# Patient Record
Sex: Female | Born: 1950 | Race: White | Hispanic: No | Marital: Married | Smoking: Never smoker
Health system: Southern US, Community
[De-identification: ages and names within clinical notes are randomized; demographics above are authoritative.]

## PROBLEM LIST (undated history)

## (undated) DIAGNOSIS — I10 Essential (primary) hypertension: Secondary | ICD-10-CM

## (undated) DIAGNOSIS — K219 Gastro-esophageal reflux disease without esophagitis: Secondary | ICD-10-CM

## (undated) DIAGNOSIS — F329 Major depressive disorder, single episode, unspecified: Secondary | ICD-10-CM

## (undated) DIAGNOSIS — D649 Anemia, unspecified: Secondary | ICD-10-CM

## (undated) DIAGNOSIS — E785 Hyperlipidemia, unspecified: Secondary | ICD-10-CM

## (undated) DIAGNOSIS — F32A Depression, unspecified: Secondary | ICD-10-CM

## (undated) DIAGNOSIS — H269 Unspecified cataract: Secondary | ICD-10-CM

## (undated) DIAGNOSIS — F419 Anxiety disorder, unspecified: Secondary | ICD-10-CM

## (undated) HISTORY — DX: Anemia, unspecified: D64.9

## (undated) HISTORY — PX: REDUCTION MAMMAPLASTY: SUR839

## (undated) HISTORY — DX: Essential (primary) hypertension: I10

## (undated) HISTORY — DX: Hyperlipidemia, unspecified: E78.5

## (undated) HISTORY — DX: Gastro-esophageal reflux disease without esophagitis: K21.9

## (undated) HISTORY — DX: Anxiety disorder, unspecified: F41.9

## (undated) HISTORY — DX: Major depressive disorder, single episode, unspecified: F32.9

## (undated) HISTORY — DX: Unspecified cataract: H26.9

## (undated) HISTORY — PX: TUBAL LIGATION: SHX77

## (undated) HISTORY — DX: Depression, unspecified: F32.A

## (undated) HISTORY — PX: BREAST SURGERY: SHX581

---

## 2013-03-13 LAB — HM COLONOSCOPY

## 2016-03-13 ENCOUNTER — Ambulatory Visit (INDEPENDENT_AMBULATORY_CARE_PROVIDER_SITE_OTHER): Payer: BLUE CROSS/BLUE SHIELD | Admitting: Medical

## 2016-03-13 ENCOUNTER — Encounter: Payer: Self-pay | Admitting: Medical

## 2016-03-13 VITALS — BP 138/84 | HR 96 | Temp 98.2°F | Ht 63.5 in | Wt 177.6 lb

## 2016-03-13 DIAGNOSIS — R030 Elevated blood-pressure reading, without diagnosis of hypertension: Secondary | ICD-10-CM | POA: Diagnosis not present

## 2016-03-13 DIAGNOSIS — E785 Hyperlipidemia, unspecified: Secondary | ICD-10-CM

## 2016-03-13 DIAGNOSIS — Z23 Encounter for immunization: Secondary | ICD-10-CM | POA: Diagnosis not present

## 2016-03-13 DIAGNOSIS — Z0000 Encounter for general adult medical examination without abnormal findings: Secondary | ICD-10-CM

## 2016-03-13 DIAGNOSIS — Z1239 Encounter for other screening for malignant neoplasm of breast: Secondary | ICD-10-CM | POA: Diagnosis not present

## 2016-03-13 DIAGNOSIS — F4323 Adjustment disorder with mixed anxiety and depressed mood: Secondary | ICD-10-CM

## 2016-03-13 LAB — CBC WITH DIFFERENTIAL/PLATELET
BASOS ABS: 0 10*3/uL (ref 0.0–0.1)
BASOS PCT: 0.4 % (ref 0.0–3.0)
Eosinophils Absolute: 0.1 10*3/uL (ref 0.0–0.7)
Eosinophils Relative: 1.8 % (ref 0.0–5.0)
HCT: 42.5 % (ref 36.0–46.0)
Hemoglobin: 14.4 g/dL (ref 12.0–15.0)
LYMPHS ABS: 1.6 10*3/uL (ref 0.7–4.0)
Lymphocytes Relative: 25.4 % (ref 12.0–46.0)
MCHC: 33.8 g/dL (ref 30.0–36.0)
MCV: 85.7 fl (ref 78.0–100.0)
MONOS PCT: 5.2 % (ref 3.0–12.0)
Monocytes Absolute: 0.3 10*3/uL (ref 0.1–1.0)
NEUTROS ABS: 4.2 10*3/uL (ref 1.4–7.7)
Neutrophils Relative %: 67.2 % (ref 43.0–77.0)
PLATELETS: 264 10*3/uL (ref 150.0–400.0)
RBC: 4.96 Mil/uL (ref 3.87–5.11)
RDW: 14 % (ref 11.5–15.5)
WBC: 6.3 10*3/uL (ref 4.0–10.5)

## 2016-03-13 LAB — COMPREHENSIVE METABOLIC PANEL
ALBUMIN: 4.4 g/dL (ref 3.5–5.2)
ALK PHOS: 53 U/L (ref 39–117)
ALT: 21 U/L (ref 0–35)
AST: 17 U/L (ref 0–37)
BILIRUBIN TOTAL: 0.4 mg/dL (ref 0.2–1.2)
BUN: 17 mg/dL (ref 6–23)
CALCIUM: 9.1 mg/dL (ref 8.4–10.5)
CO2: 30 meq/L (ref 19–32)
CREATININE: 0.74 mg/dL (ref 0.40–1.20)
Chloride: 103 mEq/L (ref 96–112)
GFR: 84.07 mL/min (ref >60.00)
Glucose, Bld: 120 mg/dL — ABNORMAL HIGH (ref 70–99)
Potassium: 3.7 mEq/L (ref 3.5–5.1)
Sodium: 141 mEq/L (ref 135–145)
TOTAL PROTEIN: 7.2 g/dL (ref 6.0–8.3)

## 2016-03-13 LAB — LIPID PANEL
CHOLESTEROL: 306 mg/dL — AB (ref 0–200)
HDL: 47.5 mg/dL (ref >39.00)
LDL CALC: 220 mg/dL — AB (ref 0–99)
NonHDL: 258.18
TRIGLYCERIDES: 191 mg/dL — AB (ref 0.0–149.0)
Total CHOL/HDL Ratio: 6
VLDL: 38.2 mg/dL (ref 0.0–40.0)

## 2016-03-13 LAB — POCT URINALYSIS DIPSTICK
Bilirubin, UA: NEGATIVE
Blood, UA: NEGATIVE
Glucose, UA: NEGATIVE
KETONES UA: NEGATIVE
LEUKOCYTES UA: NEGATIVE
Nitrite, UA: NEGATIVE
PH UA: 6
PROTEIN UA: NEGATIVE
Spec Grav, UA: 1.025
UROBILINOGEN UA: 4

## 2016-03-13 LAB — TSH: TSH: 2.27 u[IU]/mL (ref 0.35–4.50)

## 2016-03-13 LAB — HIV ANTIBODY (ROUTINE TESTING W REFLEX): HIV: NONREACTIVE

## 2016-03-13 MED ORDER — TRAZODONE HCL 50 MG PO TABS: 25.0000 mg | ORAL_TABLET | Freq: Every evening | ORAL | 3 refills | Status: AC | PRN

## 2016-03-13 MED FILL — traZODone HCL 50 MG TABS: 50 | 30 days supply | Qty: 30 | Fill #0

## 2016-03-13 NOTE — Progress Notes (Signed)
Pre visit review using our clinic tool,if applicable. No additional management support is needed unless otherwise documented below in the visit note.  

## 2016-03-13 NOTE — Progress Notes (Signed)
Subjective:    Patient ID: Jordan SinningJane Goodall, female    DOB: 1951-01-01, 65 y.o.   MRN: [id number]  HPI   Pt in for first time.  I have reviewed pt PMH, PSH, FH, Social History and Surgical History.  Exercises 3-4 days a week, dieting(eating meats and vegetables), married.  Pt is new to area for 2 years.  Pt has some depression and anxiety. Some at times related to extreme life stressors. Pt does have some insomnia. For years in the past. Pt has used trazadone in past for insomnia. Did help. Pt mood is controlled presently. Has been for some time. Last time felt depressed mood and anxious was after dad death.  Pt had colonosocpy about 3 years ago and told to repeat in 10 years. Pt last mammogram has been more than 4 yrs since last one.  Pt states pap smear last done 2 years ago. She states was normal.   Pt had some reflux in the past but no any recently.   Pt bp is controlled today. Pt bp was high in the past. But she checks it in past often. It is always lower than 140/90. When first worked up occurred being worked up for accident and was in pain. Also weighed 15 lbs heavier.(lost weight over past year purposeful weight loss)  Pt has history of low level anemia. She states no cause found. Occasionally used iron in the past. No hx of transfusions.  Pt has history of hypercholesteralemia. Pt refuses to be on any meds. Her dad had extreme reaction to statins per her report.  Pt has not had flu vaccine. She is willing to get.     Review of Systems  Constitutional: Negative for chills, fatigue and fever.  HENT: Negative for dental problem and ear discharge.   Respiratory: Negative for cough, chest tightness, shortness of breath and wheezing.   Cardiovascular: Negative for chest pain and palpitations.  Gastrointestinal: Negative for abdominal pain and anal bleeding.  Musculoskeletal: Negative for back pain.  Skin: Negative for rash.  Neurological: Negative for dizziness, speech  difficulty, weakness, numbness and headaches.  Hematological: Negative for adenopathy. Does not bruise/bleed easily.  Psychiatric/Behavioral: Negative for behavioral problems and confusion.       Pt states history of ADD.     Past Medical History:  Diagnosis Date  . Anemia   . Anxiety   . Cataract   . Depression   . GERD (gastroesophageal reflux disease)   . Hyperlipidemia   . Hypertension      Social History   Social History  . Marital status: Married    Spouse name: N/A  . Number of children: N/A  . Years of education: N/A   Occupational History  . Not on file.   Social History Main Topics  . Smoking status: Never Smoker  . Smokeless tobacco: Never Used  . Alcohol use Yes  . Drug use: Unknown  . Sexual activity: Not on file   Other Topics Concern  . Not on file   Social History Narrative  . No narrative on file    Past Surgical History:  Procedure Laterality Date  . BREAST SURGERY     Reduction  . TUBAL LIGATION      Family History  Problem Relation Age of Onset  . Hypertension Mother   . Dementia Mother   . Hypertension Father   . Hyperlipidemia Father     Allergies  Allergen Reactions  . Codeine     No  current outpatient prescriptions on file prior to visit.   No current facility-administered medications on file prior to visit.     BP 138/84   Pulse 96   Temp 98.2 F (36.8 C) (Oral)   Ht 5' 3.5" (1.613 m)   Wt 177 lb 9.6 oz (80.6 kg)   SpO2 98%   BMI 30.97 kg/m       Objective:   Physical Exam  General Mental Status- Alert. General Appearance- Not in acute distress.   Skin General: Color- Normal Color. Moisture- Normal Moisture.  Neck Carotid Arteries- Normal color. Moisture- Normal Moisture. No carotid bruits. No JVD.  Chest and Lung Exam Auscultation: Breath Sounds:-Normal.  Cardiovascular Auscultation:Rythm- Regular. Murmurs & Other Heart Sounds:Auscultation of the heart reveals- No  Murmurs.  Abdomen Inspection:-Inspeection Normal. Palpation/Percussion:Note:No mass. Palpation and Percussion of the abdomen reveal- Non Tender, Non Distended + BS, no rebound or guarding.   Neurologic Cranial Nerve exam:- CN III-XII intact(No nystagmus), symmetric smile. Strength:- 5/5 equal and symmetric strength both upper and lower extremities.  Skin- pt has 2 seborrheic keratosis on her back. About 6 mm in diameter. Other wise no worrisome lesions.(pt declines referral to derm)      Assessment & Plan:  Wellness examination Cbc, cmp tsh, lipid panel, and get ua. Pt will defer her flu vaccine until September or early October. Pt willing to get tdap.  Up to date on colonosocopy.  Recommend gettting papsmear next year. We can do here on wellness or refer to gyn  Order placed for mammogram today.(Pt states done with [facility] connected to [place]).       Asked MA Clydie Braun to abstract her colonoscopy done 3 yrs ago and papsmear done 2 years ago.  For insomnia rx trazadone.  Follow up date to be determined after lab review.  Rx trazadone for insomnia.  Eesha Schmaltz, Ramon Dredge, PA-C

## 2016-03-13 NOTE — Patient Instructions (Addendum)
Wellness examination Cbc, cmp tsh, lipid panel, and get ua. Pt will defer her flu vaccine until September or early October. Pt willing to get tdap.  Up to date on colonosocopy.  Recommend gettting papsmear next year. We can do here on wellness or refer to gyn  Order placed for mammogram today.(Pt states done with [facility] connected to [place]).     Rx trazadone. For insomnia  Follow up 1 date to be determined after lab review.  Preventive Care for Adults, Female A healthy lifestyle and preventive care can promote health and wellness. Preventive health guidelines for women include the following key practices.  A routine yearly physical is a good way to check with your health care provider about your health and preventive screening. It is a chance to share any concerns and updates on your health and to receive a thorough exam.  Visit your dentist for a routine exam and preventive care every 6 months. Brush your teeth twice a day and floss once a day. Good oral hygiene prevents tooth decay and gum disease.  The frequency of eye exams is based on your age, health, family medical history, use of contact lenses, and other factors. Follow your health care provider's recommendations for frequency of eye exams.  Eat a healthy diet. Foods like vegetables, fruits, whole grains, low-fat dairy products, and lean protein foods contain the nutrients you need without too many calories. Decrease your intake of foods high in solid fats, added sugars, and salt. Eat the right amount of calories for you.Get information about a proper diet from your health care provider, if necessary.  Regular physical exercise is one of the most important things you can do for your health. Most adults should get at least 150 minutes of moderate-intensity exercise (any activity that increases your heart rate and causes you to sweat) each week. In addition, most adults need muscle-strengthening exercises on 2 or more days a  week.  Maintain a healthy weight. The body mass index (BMI) is a screening tool to identify possible weight problems. It provides an estimate of body fat based on height and weight. Your health care provider can find your BMI and can help you achieve or maintain a healthy weight.For adults 20 years and older:  A BMI below 18.5 is considered underweight.  A BMI of 18.5 to 24.9 is normal.  A BMI of 25 to 29.9 is considered overweight.  A BMI of 30 and above is considered obese.  Maintain normal blood lipids and cholesterol levels by exercising and minimizing your intake of saturated fat. Eat a balanced diet with plenty of fruit and vegetables. Blood tests for lipids and cholesterol should begin at age 7 and be repeated every 5 years. If your lipid or cholesterol levels are high, you are over 50, or you are at high risk for heart disease, you may need your cholesterol levels checked more frequently.Ongoing high lipid and cholesterol levels should be treated with medicines if diet and exercise are not working.  If you smoke, find out from your health care provider how to quit. If you do not use tobacco, do not start.  Lung cancer screening is recommended for adults aged 71-80 years who are at high risk for developing lung cancer because of a history of smoking. A yearly low-dose CT scan of the lungs is recommended for people who have at least a 30-pack-year history of smoking and are a current smoker or have quit within the past 15 years. A pack year  of smoking is smoking an average of 1 pack of cigarettes a day for 1 year (for example: 1 pack a day for 30 years or 2 packs a day for 15 years). Yearly screening should continue until the smoker has stopped smoking for at least 15 years. Yearly screening should be stopped for people who develop a health problem that would prevent them from having lung cancer treatment.  If you are pregnant, do not drink alcohol. If you are breastfeeding, be very  cautious about drinking alcohol. If you are not pregnant and choose to drink alcohol, do not have more than 1 drink per day. One drink is considered to be 12 ounces (355 mL) of beer, 5 ounces (148 mL) of wine, or 1.5 ounces (44 mL) of liquor.  Avoid use of street drugs. Do not share needles with anyone. Ask for help if you need support or instructions about stopping the use of drugs.  High blood pressure causes heart disease and increases the risk of stroke. Your blood pressure should be checked at least every 1 to 2 years. Ongoing high blood pressure should be treated with medicines if weight loss and exercise do not work.  If you are 34-47 years old, ask your health care provider if you should take aspirin to prevent strokes.  Diabetes screening is done by taking a blood sample to check your blood glucose level after you have not eaten for a certain period of time (fasting). If you are not overweight and you do not have risk factors for diabetes, you should be screened once every 3 years starting at age 28. If you are overweight or obese and you are 74-53 years of age, you should be screened for diabetes every year as part of your cardiovascular risk assessment.  Breast cancer screening is essential preventive care for women. You should practice "breast self-awareness." This means understanding the normal appearance and feel of your breasts and may include breast self-examination. Any changes detected, no matter how small, should be reported to a health care provider. Women in their 77s and 30s should have a clinical breast exam (CBE) by a health care provider as part of a regular health exam every 1 to 3 years. After age 65, women should have a CBE every year. Starting at age 43, women should consider having a mammogram (breast X-ray test) every year. Women who have a family history of breast cancer should talk to their health care provider about genetic screening. Women at a high risk of breast cancer  should talk to their health care providers about having an MRI and a mammogram every year.  Breast cancer gene (BRCA)-related cancer risk assessment is recommended for women who have family members with BRCA-related cancers. BRCA-related cancers include breast, ovarian, tubal, and peritoneal cancers. Having family members with these cancers may be associated with an increased risk for harmful changes (mutations) in the breast cancer genes BRCA1 and BRCA2. Results of the assessment will determine the need for genetic counseling and BRCA1 and BRCA2 testing.  Your health care provider may recommend that you be screened regularly for cancer of the pelvic organs (ovaries, uterus, and vagina). This screening involves a pelvic examination, including checking for microscopic changes to the surface of your cervix (Pap test). You may be encouraged to have this screening done every 3 years, beginning at age 71.  For women ages 40-65, health care providers may recommend pelvic exams and Pap testing every 3 years, or they may recommend the Pap  and pelvic exam, combined with testing for human papilloma virus (HPV), every 5 years. Some types of HPV increase your risk of cervical cancer. Testing for HPV may also be done on women of any age with unclear Pap test results.  Other health care providers may not recommend any screening for nonpregnant women who are considered low risk for pelvic cancer and who do not have symptoms. Ask your health care provider if a screening pelvic exam is right for you.  If you have had past treatment for cervical cancer or a condition that could lead to cancer, you need Pap tests and screening for cancer for at least 20 years after your treatment. If Pap tests have been discontinued, your risk factors (such as having a new sexual partner) need to be reassessed to determine if screening should resume. Some women have medical problems that increase the chance of getting cervical cancer. In  these cases, your health care provider may recommend more frequent screening and Pap tests.  Colorectal cancer can be detected and often prevented. Most routine colorectal cancer screening begins at the age of 54 years and continues through age 59 years. However, your health care provider may recommend screening at an earlier age if you have risk factors for colon cancer. On a yearly basis, your health care provider may provide home test kits to check for hidden blood in the stool. Use of a small camera at the end of a tube, to directly examine the colon (sigmoidoscopy or colonoscopy), can detect the earliest forms of colorectal cancer. Talk to your health care provider about this at age 32, when routine screening begins. Direct exam of the colon should be repeated every 5-10 years through age 56 years, unless early forms of precancerous polyps or small growths are found.  People who are at an increased risk for hepatitis B should be screened for this virus. You are considered at high risk for hepatitis B if:  You were born in a country where hepatitis B occurs often. Talk with your health care provider about which countries are considered high risk.  Your parents were born in a high-risk country and you have not received a shot to protect against hepatitis B (hepatitis B vaccine).  You have HIV or AIDS.  You use needles to inject street drugs.  You live with, or have sex with, someone who has hepatitis B.  You get hemodialysis treatment.  You take certain medicines for conditions like cancer, organ transplantation, and autoimmune conditions.  Hepatitis C blood testing is recommended for all people born from [birth year] through 1965 and any individual with known risks for hepatitis C.  Practice safe sex. Use condoms and avoid high-risk sexual practices to reduce the spread of sexually transmitted infections (STIs). STIs include gonorrhea, chlamydia, syphilis, trichomonas, herpes, HPV, and human  immunodeficiency virus (HIV). Herpes, HIV, and HPV are viral illnesses that have no cure. They can result in disability, cancer, and death.  You should be screened for sexually transmitted illnesses (STIs) including gonorrhea and chlamydia if:  You are sexually active and are younger than 24 years.  You are older than 24 years and your health care provider tells you that you are at risk for this type of infection.  Your sexual activity has changed since you were last screened and you are at an increased risk for chlamydia or gonorrhea. Ask your health care provider if you are at risk.  If you are at risk of being infected with HIV, it  is recommended that you take a prescription medicine daily to prevent HIV infection. This is called preexposure prophylaxis (PrEP). You are considered at risk if:  You are sexually active and do not regularly use condoms or know the HIV status of your partner(s).  You take drugs by injection.  You are sexually active with a partner who has HIV.  Talk with your health care provider about whether you are at high risk of being infected with HIV. If you choose to begin PrEP, you should first be tested for HIV. You should then be tested every 3 months for as long as you are taking PrEP.  Osteoporosis is a disease in which the bones lose minerals and strength with aging. This can result in serious bone fractures or breaks. The risk of osteoporosis can be identified using a bone density scan. Women ages 31 years and over and women at risk for fractures or osteoporosis should discuss screening with their health care providers. Ask your health care provider whether you should take a calcium supplement or vitamin D to reduce the rate of osteoporosis.  Menopause can be associated with physical symptoms and risks. Hormone replacement therapy is available to decrease symptoms and risks. You should talk to your health care provider about whether hormone replacement therapy is  right for you.  Use sunscreen. Apply sunscreen liberally and repeatedly throughout the day. You should seek shade when your shadow is shorter than you. Protect yourself by wearing long sleeves, pants, a wide-brimmed hat, and sunglasses year round, whenever you are outdoors.  Once a month, do a whole body skin exam, using a mirror to look at the skin on your back. Tell your health care provider of new moles, moles that have irregular borders, moles that are larger than a pencil eraser, or moles that have changed in shape or color.  Stay current with required vaccines (immunizations).  Influenza vaccine. All adults should be immunized every year.  Tetanus, diphtheria, and acellular pertussis (Td, Tdap) vaccine. Pregnant women should receive 1 dose of Tdap vaccine during each pregnancy. The dose should be obtained regardless of the length of time since the last dose. Immunization is preferred during the 27th-36th week of gestation. An adult who has not previously received Tdap or who does not know her vaccine status should receive 1 dose of Tdap. This initial dose should be followed by tetanus and diphtheria toxoids (Td) booster doses every 10 years. Adults with an unknown or incomplete history of completing a 3-dose immunization series with Td-containing vaccines should begin or complete a primary immunization series including a Tdap dose. Adults should receive a Td booster every 10 years.  Varicella vaccine. An adult without evidence of immunity to varicella should receive 2 doses or a second dose if she has previously received 1 dose. Pregnant females who do not have evidence of immunity should receive the first dose after pregnancy. This first dose should be obtained before leaving the health care facility. The second dose should be obtained 4-8 weeks after the first dose.  Human papillomavirus (HPV) vaccine. Females aged 13-26 years who have not received the vaccine previously should obtain the  3-dose series. The vaccine is not recommended for use in pregnant females. However, pregnancy testing is not needed before receiving a dose. If a female is found to be pregnant after receiving a dose, no treatment is needed. In that case, the remaining doses should be delayed until after the pregnancy. Immunization is recommended for any person with an  immunocompromised condition through the age of 80 years if she did not get any or all doses earlier. During the 3-dose series, the second dose should be obtained 4-8 weeks after the first dose. The third dose should be obtained 24 weeks after the first dose and 16 weeks after the second dose.  Zoster vaccine. One dose is recommended for adults aged 40 years or older unless certain conditions are present.  Measles, mumps, and rubella (MMR) vaccine. Adults born before [birth year] generally are considered immune to measles and mumps. Adults born in [birth year] or later should have 1 or more doses of MMR vaccine unless there is a contraindication to the vaccine or there is laboratory evidence of immunity to each of the three diseases. A routine second dose of MMR vaccine should be obtained at least 28 days after the first dose for students attending postsecondary schools, health care workers, or international travelers. People who received inactivated measles vaccine or an unknown type of measles vaccine during 1963-1967 should receive 2 doses of MMR vaccine. People who received inactivated mumps vaccine or an unknown type of mumps vaccine before 1979 and are at high risk for mumps infection should consider immunization with 2 doses of MMR vaccine. For females of childbearing age, rubella immunity should be determined. If there is no evidence of immunity, females who are not pregnant should be vaccinated. If there is no evidence of immunity, females who are pregnant should delay immunization until after pregnancy. Unvaccinated health care workers born before [birth year] who lack  laboratory evidence of measles, mumps, or rubella immunity or laboratory confirmation of disease should consider measles and mumps immunization with 2 doses of MMR vaccine or rubella immunization with 1 dose of MMR vaccine.  Pneumococcal 13-valent conjugate (PCV13) vaccine. When indicated, a person who is uncertain of his immunization history and has no record of immunization should receive the PCV13 vaccine. All adults 59 years of age and older should receive this vaccine. An adult aged 102 years or older who has certain medical conditions and has not been previously immunized should receive 1 dose of PCV13 vaccine. This PCV13 should be followed with a dose of pneumococcal polysaccharide (PPSV23) vaccine. Adults who are at high risk for pneumococcal disease should obtain the PPSV23 vaccine at least 8 weeks after the dose of PCV13 vaccine. Adults older than 64 years of age who have normal immune system function should obtain the PPSV23 vaccine dose at least 1 year after the dose of PCV13 vaccine.  Pneumococcal polysaccharide (PPSV23) vaccine. When PCV13 is also indicated, PCV13 should be obtained first. All adults aged 53 years and older should be immunized. An adult younger than age 73 years who has certain medical conditions should be immunized. Any person who resides in a nursing home or long-term care facility should be immunized. An adult smoker should be immunized. People with an immunocompromised condition and certain other conditions should receive both PCV13 and PPSV23 vaccines. People with human immunodeficiency virus (HIV) infection should be immunized as soon as possible after diagnosis. Immunization during chemotherapy or radiation therapy should be avoided. Routine use of PPSV23 vaccine is not recommended for American Indians, [place] Natives, or people younger than 65 years unless there are medical conditions that require PPSV23 vaccine. When indicated, people who have unknown immunization and have  no record of immunization should receive PPSV23 vaccine. One-time revaccination 5 years after the first dose of PPSV23 is recommended for people aged 19-64 years who have chronic kidney failure, nephrotic syndrome, asplenia,  or immunocompromised conditions. People who received 1-2 doses of PPSV23 before age 68 years should receive another dose of PPSV23 vaccine at age 67 years or later if at least 5 years have passed since the previous dose. Doses of PPSV23 are not needed for people immunized with PPSV23 at or after age 24 years.  Meningococcal vaccine. Adults with asplenia or persistent complement component deficiencies should receive 2 doses of quadrivalent meningococcal conjugate (MenACWY-D) vaccine. The doses should be obtained at least 2 months apart. Microbiologists working with certain meningococcal bacteria, [place] recruits, people at risk during an outbreak, and people who travel to or live in countries with a high rate of meningitis should be immunized. A first-year college student up through age 55 years who is living in a residence hall should receive a dose if she did not receive a dose on or after her 16th birthday. Adults who have certain high-risk conditions should receive one or more doses of vaccine.  Hepatitis A vaccine. Adults who wish to be protected from this disease, have certain high-risk conditions, work with hepatitis A-infected animals, work in hepatitis A research labs, or travel to or work in countries with a high rate of hepatitis A should be immunized. Adults who were previously unvaccinated and who anticipate close contact with an international adoptee during the first 60 days after arrival in the [place] from a country with a high rate of hepatitis A should be immunized.  Hepatitis B vaccine. Adults who wish to be protected from this disease, have certain high-risk conditions, may be exposed to blood or other infectious body fluids, are household contacts or sex  partners of hepatitis B positive people, are clients or workers in certain care facilities, or travel to or work in countries with a high rate of hepatitis B should be immunized.  Haemophilus influenzae type b (Hib) vaccine. A previously unvaccinated person with asplenia or sickle cell disease or having a scheduled splenectomy should receive 1 dose of Hib vaccine. Regardless of previous immunization, a recipient of a hematopoietic stem cell transplant should receive a 3-dose series 6-12 months after her successful transplant. Hib vaccine is not recommended for adults with HIV infection. Preventive Services / Frequency Ages 67 to 40 years  Blood pressure check.** / Every 3-5 years.  Lipid and cholesterol check.** / Every 5 years beginning at age 46.  Clinical breast exam.** / Every 3 years for women in their 56s and 79s.  BRCA-related cancer risk assessment.** / For women who have family members with a BRCA-related cancer (breast, ovarian, tubal, or peritoneal cancers).  Pap test.** / Every 2 years from ages 40 through 110. Every 3 years starting at age 67 through age 48 or 86 with a history of 3 consecutive normal Pap tests.  HPV screening.** / Every 3 years from ages 70 through ages 52 to 68 with a history of 3 consecutive normal Pap tests.  Hepatitis C blood test.** / For any individual with known risks for hepatitis C.  Skin self-exam. / Monthly.  Influenza vaccine. / Every year.  Tetanus, diphtheria, and acellular pertussis (Tdap, Td) vaccine.** / Consult your health care provider. Pregnant women should receive 1 dose of Tdap vaccine during each pregnancy. 1 dose of Td every 10 years.  Varicella vaccine.** / Consult your health care provider. Pregnant females who do not have evidence of immunity should receive the first dose after pregnancy.  HPV vaccine. / 3 doses over 6 months, if 65 and younger. The vaccine is not  recommended for use in pregnant females. However, pregnancy testing  is not needed before receiving a dose.  Measles, mumps, rubella (MMR) vaccine.** / You need at least 1 dose of MMR if you were born in 1957 or later. You may also need a 2nd dose. For females of childbearing age, rubella immunity should be determined. If there is no evidence of immunity, females who are not pregnant should be vaccinated. If there is no evidence of immunity, females who are pregnant should delay immunization until after pregnancy.  Pneumococcal 13-valent conjugate (PCV13) vaccine.** / Consult your health care provider.  Pneumococcal polysaccharide (PPSV23) vaccine.** / 1 to 2 doses if you smoke cigarettes or if you have certain conditions.  Meningococcal vaccine.** / 1 dose if you are age 65 to 41 years and a Market researcher living in a residence hall, or have one of several medical conditions, you need to get vaccinated against meningococcal disease. You may also need additional booster doses.  Hepatitis A vaccine.** / Consult your health care provider.  Hepatitis B vaccine.** / Consult your health care provider.  Haemophilus influenzae type b (Hib) vaccine.** / Consult your health care provider. Ages 41 to 4 years  Blood pressure check.** / Every year.  Lipid and cholesterol check.** / Every 5 years beginning at age 60 years.  Lung cancer screening. / Every year if you are aged 43-80 years and have a 30-pack-year history of smoking and currently smoke or have quit within the past 15 years. Yearly screening is stopped once you have quit smoking for at least 15 years or develop a health problem that would prevent you from having lung cancer treatment.  Clinical breast exam.** / Every year after age 77 years.  BRCA-related cancer risk assessment.** / For women who have family members with a BRCA-related cancer (breast, ovarian, tubal, or peritoneal cancers).  Mammogram.** / Every year beginning at age 11 years and continuing for as long as you are in good  health. Consult with your health care provider.  Pap test.** / Every 3 years starting at age 74 years through age 45 or 70 years with a history of 3 consecutive normal Pap tests.  HPV screening.** / Every 3 years from ages 24 years through ages 78 to 66 years with a history of 3 consecutive normal Pap tests.  Fecal occult blood test (FOBT) of stool. / Every year beginning at age 39 years and continuing until age 97 years. You may not need to do this test if you get a colonoscopy every 10 years.  Flexible sigmoidoscopy or colonoscopy.** / Every 5 years for a flexible sigmoidoscopy or every 10 years for a colonoscopy beginning at age 72 years and continuing until age 48 years.  Hepatitis C blood test.** / For all people born from [birth year] through 1964 and any individual with known risks for hepatitis C.  Skin self-exam. / Monthly.  Influenza vaccine. / Every year.  Tetanus, diphtheria, and acellular pertussis (Tdap/Td) vaccine.** / Consult your health care provider. Pregnant women should receive 1 dose of Tdap vaccine during each pregnancy. 1 dose of Td every 10 years.  Varicella vaccine.** / Consult your health care provider. Pregnant females who do not have evidence of immunity should receive the first dose after pregnancy.  Zoster vaccine.** / 1 dose for adults aged 77 years or older.  Measles, mumps, rubella (MMR) vaccine.** / You need at least 1 dose of MMR if you were born in 1957 or later. You may also need  a second dose. For females of childbearing age, rubella immunity should be determined. If there is no evidence of immunity, females who are not pregnant should be vaccinated. If there is no evidence of immunity, females who are pregnant should delay immunization until after pregnancy.  Pneumococcal 13-valent conjugate (PCV13) vaccine.** / Consult your health care provider.  Pneumococcal polysaccharide (PPSV23) vaccine.** / 1 to 2 doses if you smoke cigarettes or if you have certain  conditions.  Meningococcal vaccine.** / Consult your health care provider.  Hepatitis A vaccine.** / Consult your health care provider.  Hepatitis B vaccine.** / Consult your health care provider.  Haemophilus influenzae type b (Hib) vaccine.** / Consult your health care provider. Ages 71 years and over  Blood pressure check.** / Every year.  Lipid and cholesterol check.** / Every 5 years beginning at age 93 years.  Lung cancer screening. / Every year if you are aged 31-80 years and have a 30-pack-year history of smoking and currently smoke or have quit within the past 15 years. Yearly screening is stopped once you have quit smoking for at least 15 years or develop a health problem that would prevent you from having lung cancer treatment.  Clinical breast exam.** / Every year after age 81 years.  BRCA-related cancer risk assessment.** / For women who have family members with a BRCA-related cancer (breast, ovarian, tubal, or peritoneal cancers).  Mammogram.** / Every year beginning at age 73 years and continuing for as long as you are in good health. Consult with your health care provider.  Pap test.** / Every 3 years starting at age 48 years through age 61 or 92 years with 3 consecutive normal Pap tests. Testing can be stopped between 65 and 70 years with 3 consecutive normal Pap tests and no abnormal Pap or HPV tests in the past 10 years.  HPV screening.** / Every 3 years from ages 49 years through ages 40 or 70 years with a history of 3 consecutive normal Pap tests. Testing can be stopped between 65 and 70 years with 3 consecutive normal Pap tests and no abnormal Pap or HPV tests in the past 10 years.  Fecal occult blood test (FOBT) of stool. / Every year beginning at age 32 years and continuing until age 67 years. You may not need to do this test if you get a colonoscopy every 10 years.  Flexible sigmoidoscopy or colonoscopy.** / Every 5 years for a flexible sigmoidoscopy or every 10  years for a colonoscopy beginning at age 110 years and continuing until age 91 years.  Hepatitis C blood test.** / For all people born from [birth year] through 1964 and any individual with known risks for hepatitis C.  Osteoporosis screening.** / A one-time screening for women ages 64 years and over and women at risk for fractures or osteoporosis.  Skin self-exam. / Monthly.  Influenza vaccine. / Every year.  Tetanus, diphtheria, and acellular pertussis (Tdap/Td) vaccine.** / 1 dose of Td every 10 years.  Varicella vaccine.** / Consult your health care provider.  Zoster vaccine.** / 1 dose for adults aged 64 years or older.  Pneumococcal 13-valent conjugate (PCV13) vaccine.** / Consult your health care provider.  Pneumococcal polysaccharide (PPSV23) vaccine.** / 1 dose for all adults aged 72 years and older.  Meningococcal vaccine.** / Consult your health care provider.  Hepatitis A vaccine.** / Consult your health care provider.  Hepatitis B vaccine.** / Consult your health care provider.  Haemophilus influenzae type b (Hib) vaccine.** / Consult your  health care provider. ** Family history and personal history of risk and conditions may change your health care provider's recommendations.   This information is not intended to replace advice given to you by your health care provider. Make sure you discuss any questions you have with your health care provider.   Document Released: 08/29/2001 Document Revised: 07/24/2014 Document Reviewed: 11/28/2010 Elsevier Interactive Patient Education [employer].

## 2016-03-13 NOTE — Assessment & Plan Note (Signed)
Cbc, cmp tsh, lipid panel, and get ua. Pt will defer her flu vaccine until September or early October. Pt willing to get tdap.  Up to date on colonosocopy.  Recommend gettting papsmear next year. We can do here on wellness or refer to gyn  Order placed for mammogram today.(Pt states done with [facility] to [facility]).

## 2016-03-21 ENCOUNTER — Ambulatory Visit (HOSPITAL_BASED_OUTPATIENT_CLINIC_OR_DEPARTMENT_OTHER)
Admission: RE | Admit: 2016-03-21 | Discharge: 2016-03-21 | Disposition: A | Discharge status: Home / Self Care | Payer: BLUE CROSS/BLUE SHIELD | Source: Ambulatory Visit | Attending: Medical | Admitting: Medical

## 2016-03-21 DIAGNOSIS — Z1231 Encounter for screening mammogram for malignant neoplasm of breast: Secondary | ICD-10-CM | POA: Diagnosis present

## 2016-03-21 DIAGNOSIS — Z1239 Encounter for other screening for malignant neoplasm of breast: Secondary | ICD-10-CM

## 2017-05-14 ENCOUNTER — Other Ambulatory Visit: Payer: Self-pay | Admitting: Medical

## 2017-05-14 ENCOUNTER — Ambulatory Visit (HOSPITAL_BASED_OUTPATIENT_CLINIC_OR_DEPARTMENT_OTHER)
Admission: RE | Admit: 2017-05-14 | Discharge: 2017-05-14 | Disposition: A | Discharge status: Home / Self Care | Payer: BLUE CROSS/BLUE SHIELD | Source: Ambulatory Visit | Attending: Medical | Admitting: Medical

## 2017-05-14 ENCOUNTER — Encounter (HOSPITAL_BASED_OUTPATIENT_CLINIC_OR_DEPARTMENT_OTHER): Payer: Self-pay

## 2017-05-14 DIAGNOSIS — Z1231 Encounter for screening mammogram for malignant neoplasm of breast: Secondary | ICD-10-CM | POA: Insufficient documentation

## 2017-07-20 ENCOUNTER — Encounter: Payer: BLUE CROSS/BLUE SHIELD | Admitting: Medical

## 2017-07-20 ENCOUNTER — Telehealth: Payer: Self-pay | Admitting: Medical

## 2017-07-20 NOTE — Telephone Encounter (Signed)
Glennie IsleAlston, [name], CaliforniaEdward, PA-C        Precious GildingHey Edward,   The following patient has an appointment with you 07/20/2017. The notes states that the patient wants a CPE and a PAP, However I verified that you normally refer the patients to the OBGYN office on this floor. When calling the patient to inform her that the PAP will not be done tomorrow. The patient than begin to use profanity/ inappropriate language with me and disconnected the call without canceling this appointment.   [facility] and appointment was confirmed by Vesta MixerBrigitte in our office ( which she is new and unsure of what providers do what.)    Thanks  Glennie IsleKandace    Alston, Vinnie LangtonKandace  Saguier, KermitEdward, PA-C        Precious GildingHey Edward,   The following patient has an appointment with you 07/20/2017. The notes states that the patient wants a CPE and a PAP, However I verified that you normally refer the patients to the OBGYN office on this floor. When calling the patient to inform her that the PAP will not be done tomorrow. The patient than begin to use profanity/ inappropriate language with me and disconnected the call without canceling this appointment.   [facility] and appointment was confirmed by Vesta MixerBrigitte in our office ( which she is new and unsure of what providers do what.)    Thanks  Darrold JunkerKandace

## 2017-08-12 ENCOUNTER — Telehealth: Payer: Self-pay | Admitting: Medical

## 2017-08-12 NOTE — Telephone Encounter (Signed)
On July 21, 2017 I discussed with Dr. Abner GreenspanBlyth 1 of my supervising physician regarding patient's recent conversation and her use of a lot of profanity directed directed at staff about upcoming appointment.  Dr. Abner GreenspanBlyth was made aware of this by other staff members as well.  After discussion with Dr. Abner GreenspanBlyth we did think it would be best to dismiss patient from practice.  Documenting today on August 12, 2017 as I have been thinking about the dismissal process and do think it is best to go ahead and initiate this and send a letter soon.  Overall I think at this point there is a non-therapeutic relationship and it would be in both our mutual interests if she is dismissed from the practice.

## 2017-08-21 ENCOUNTER — Telehealth: Payer: Self-pay | Admitting: Medical

## 2017-08-21 NOTE — Telephone Encounter (Signed)
Patient dismissed from [facility] by Esperanza RichtersEdward Saguier PA-C , effective August 16, 2017. Dismissal letter sent out by certified / registered mail.  daj

## 2017-08-30 NOTE — Telephone Encounter (Signed)
Received signed domestic return receipt verifying delivery of certified letter on August 27, 2017. Article number [id number] daj

## 2019-04-26 IMAGING — MG DIGITAL SCREENING BILATERAL MAMMOGRAM WITH CAD
2 series · 2 of 2 positions shown · non-contrast
Comparison: Previous exam(s).

CLINICAL DATA: Screening.

EXAM:
DIGITAL SCREENING BILATERAL MAMMOGRAM WITH CAD

[R MLO]
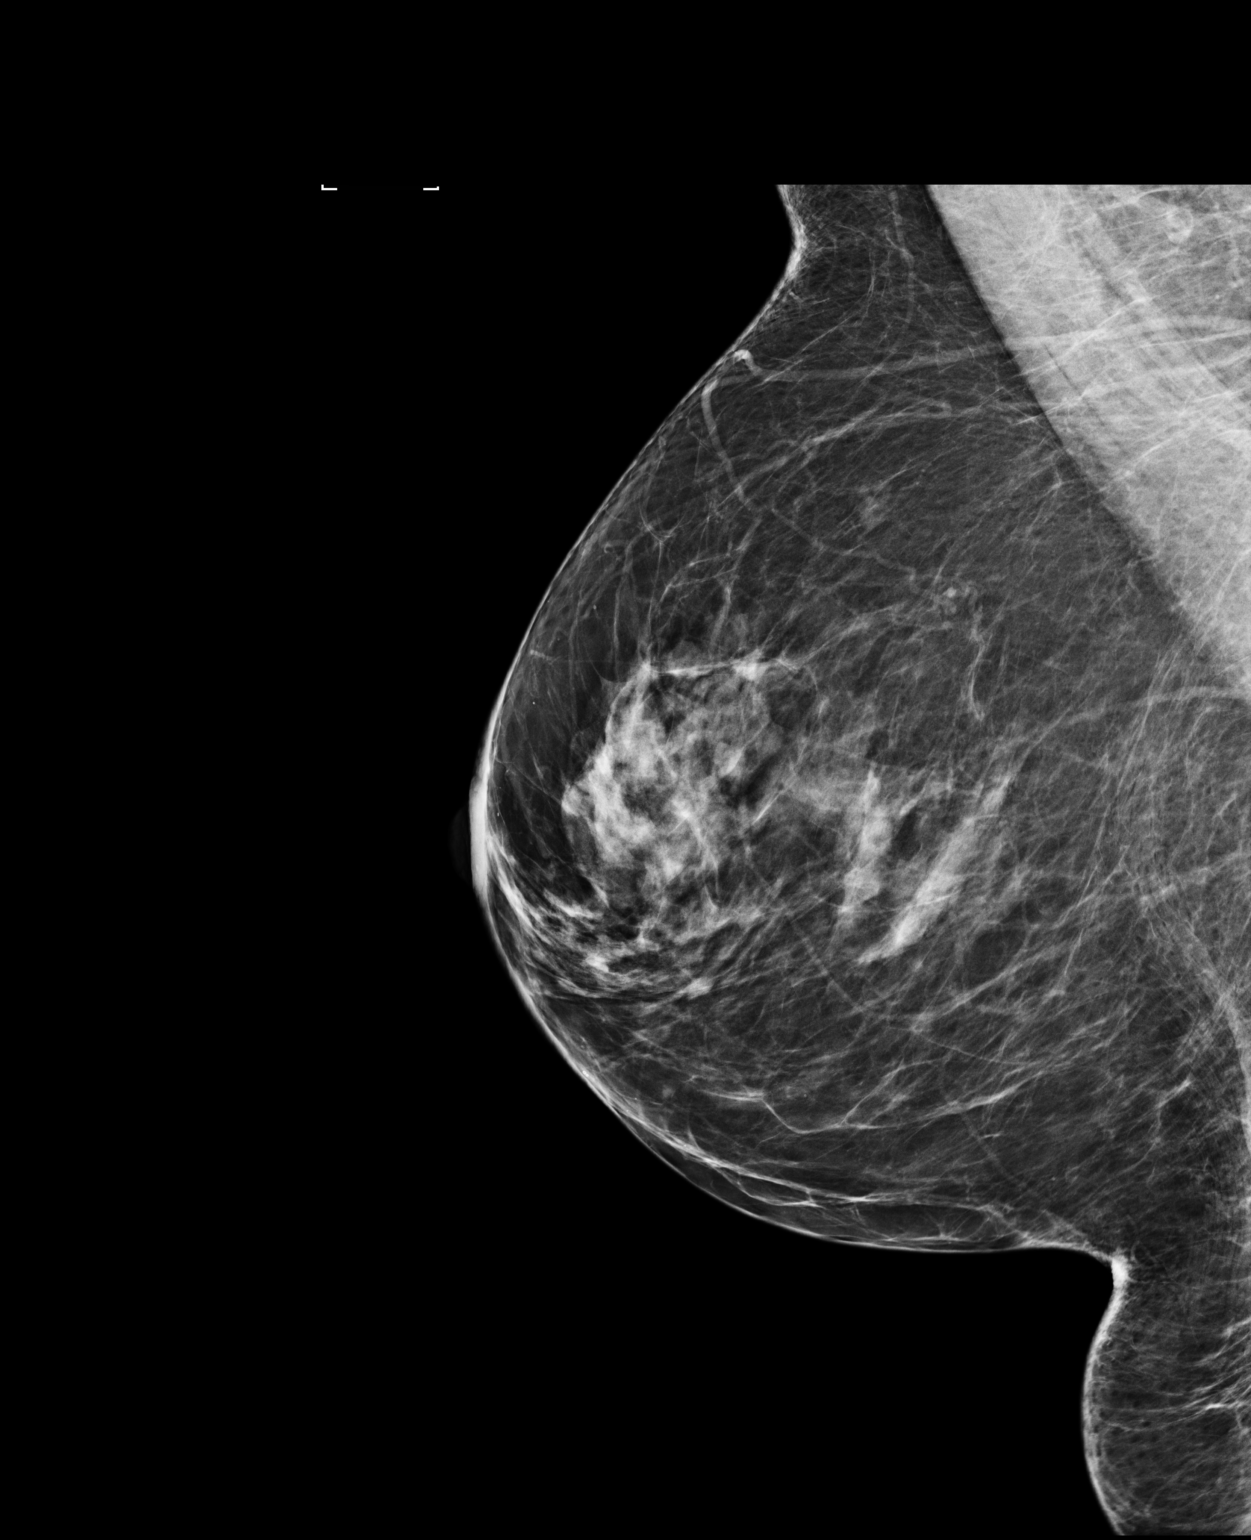

[R CC]
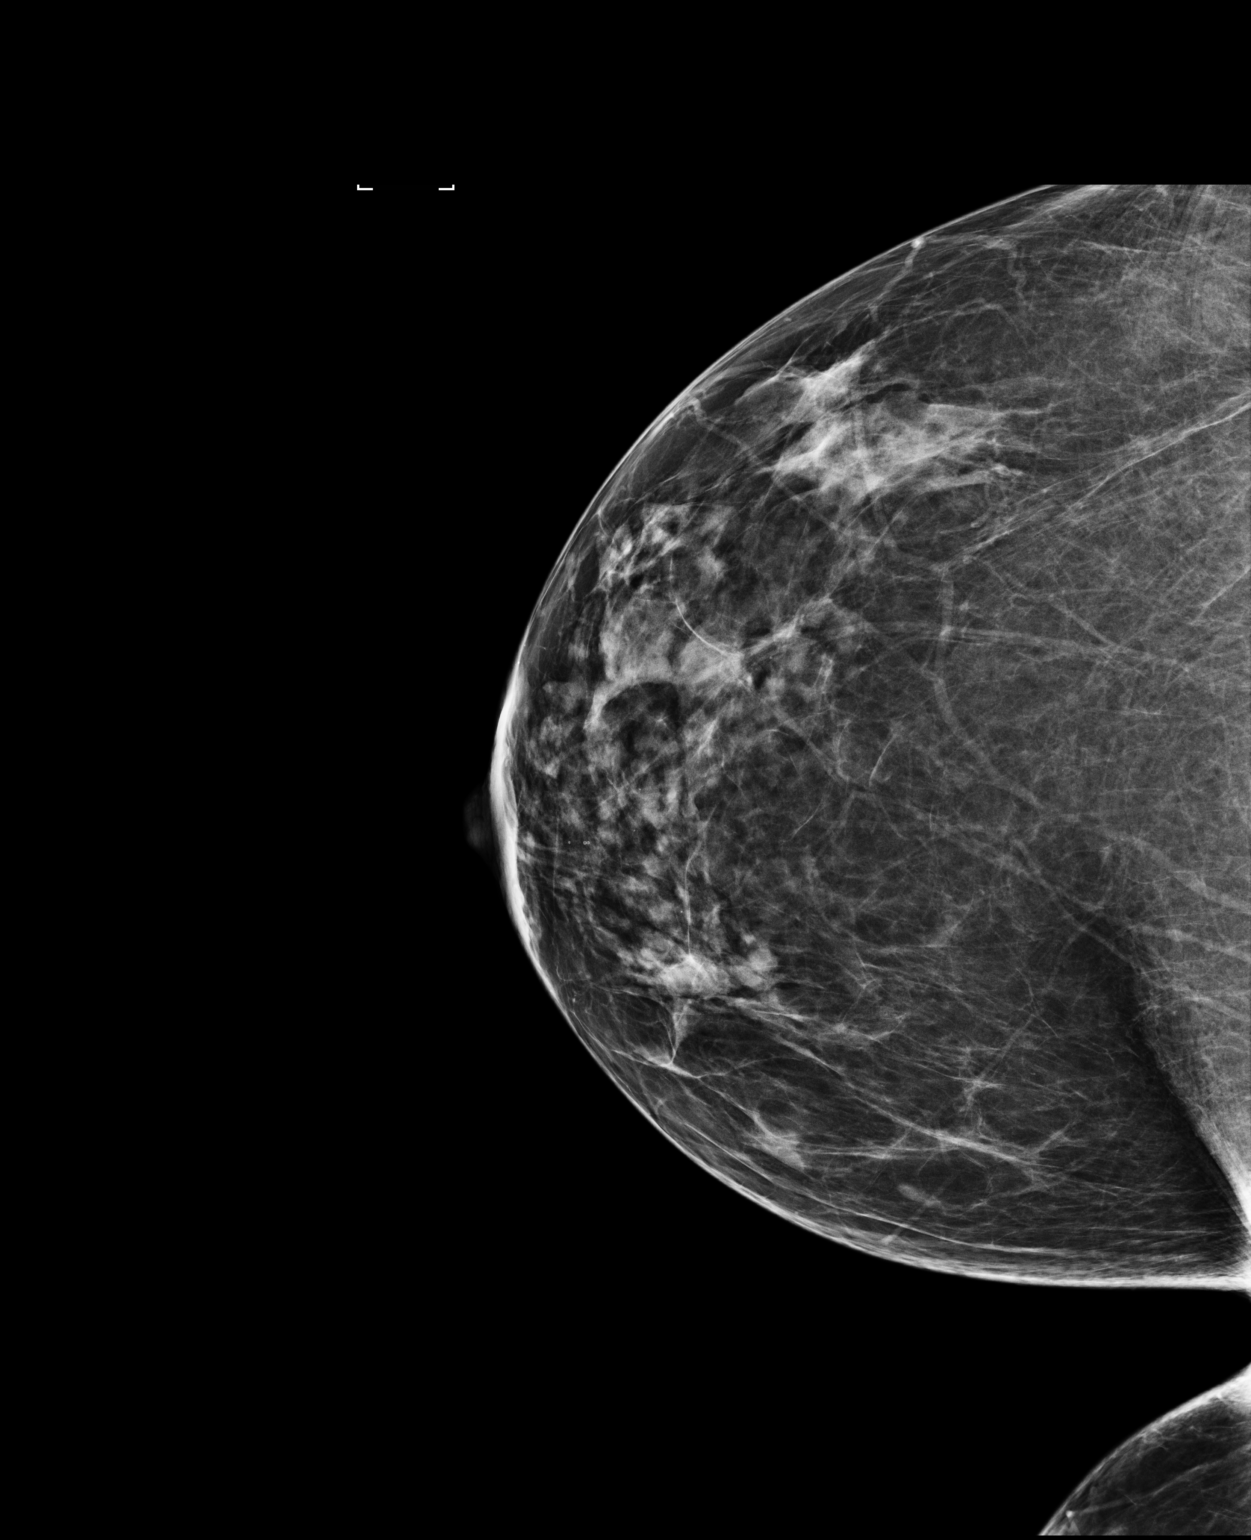

[2 of 2 positions shown; findings below may reference images not displayed]

ACR Breast Density Category c: The breast tissue is heterogeneously
dense, which may obscure small masses.
FINDINGS: There are no findings suspicious for malignancy. Images were
processed with CAD.
IMPRESSION: No mammographic evidence of malignancy. A result letter of this
screening mammogram will be mailed directly to the patient.

RECOMMENDATION:
Screening mammogram in one year. (Code:[id number])

BI-RADS CATEGORY  1: Negative.
